# Patient Record
Sex: Female | Born: 1945 | Race: White | Hispanic: No | Marital: Married
Health system: Midwestern US, Academic
[De-identification: ages and names within clinical notes are randomized; demographics above are authoritative.]

---

## 2018-04-10 ENCOUNTER — Ambulatory Visit: Admit: 2018-04-10 | Discharge: 2018-04-10

## 2018-04-10 ENCOUNTER — Encounter: Admit: 2018-04-10 | Discharge: 2018-04-10

## 2018-04-10 ENCOUNTER — Ambulatory Visit: Admit: 2018-04-10 | Discharge: 2018-04-11 | Payer: MEDICARE

## 2018-04-10 DIAGNOSIS — I259 Chronic ischemic heart disease, unspecified: Principal | ICD-10-CM

## 2018-04-10 DIAGNOSIS — I10 Essential (primary) hypertension: ICD-10-CM

## 2018-04-10 DIAGNOSIS — R079 Chest pain, unspecified: ICD-10-CM

## 2022-07-17 IMAGING — CT CTA_BRAIN_&_OR_NECK(Adult)
3 of 4 series · 12 of 33 positions shown, 14 images · non-contrast
Comparison: none

[Series 7: cta brain and/or neck cor 3.00 bv36 s3 · coronal · 0.42mm/px · 3 of 47 slices shown]
[im 15/47  bone]
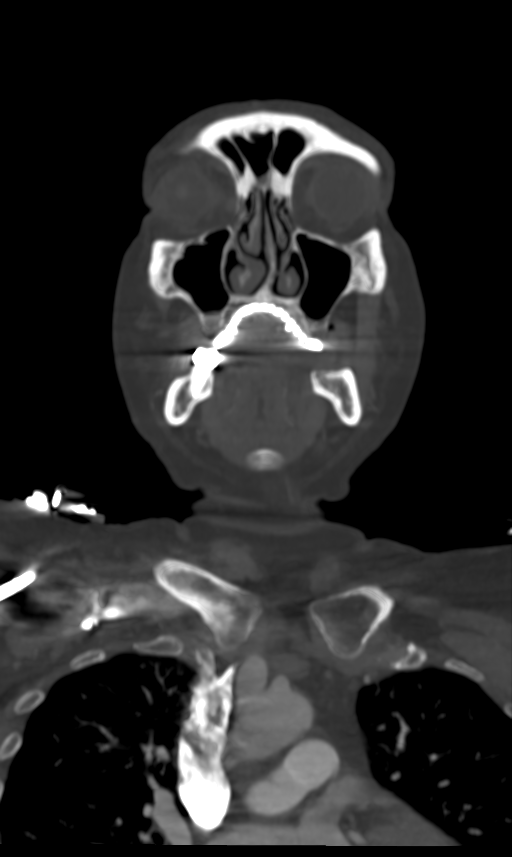
[im 21/47  bone]
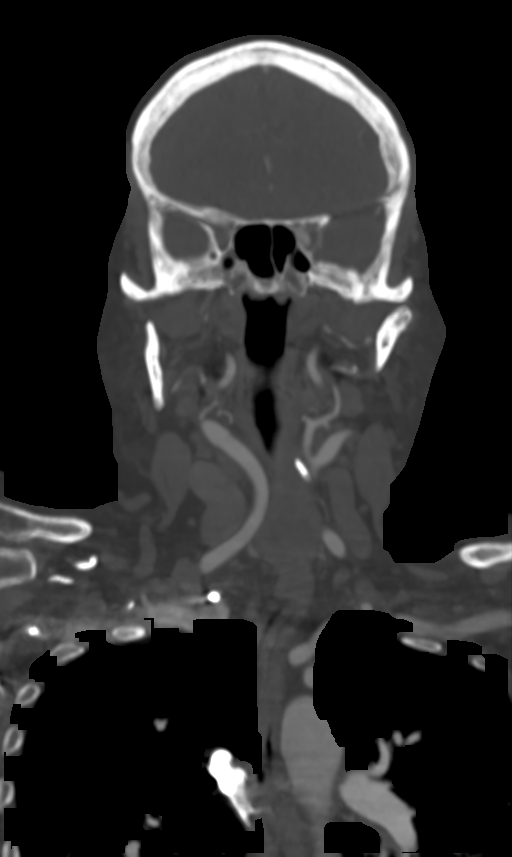
[im 27/47  bone]
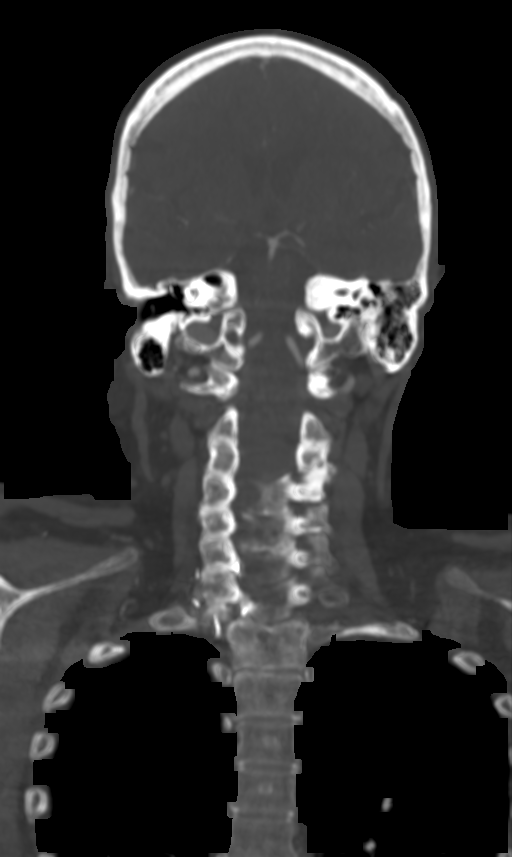

[Series 9: cta brain and/or neck sag 3.00 bv36 s3 · sagittal · 0.47mm/px · 5 of 45 slices shown, 6 images]
[im 15/45  bone]
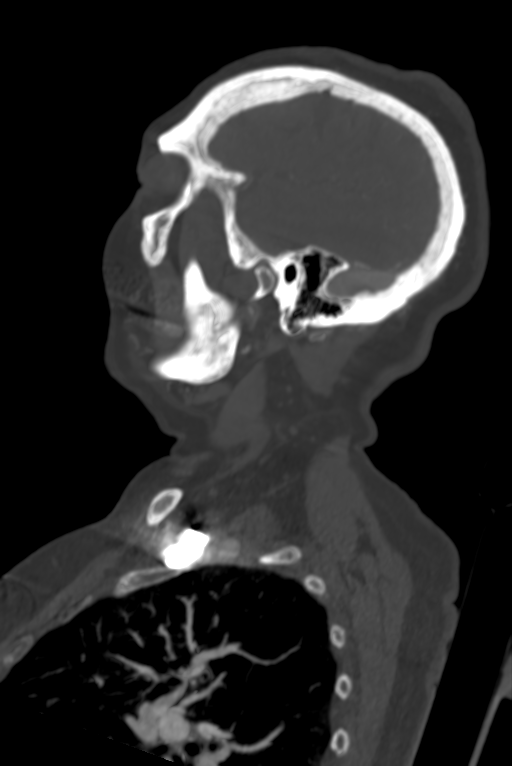
[im 19/45  bone]
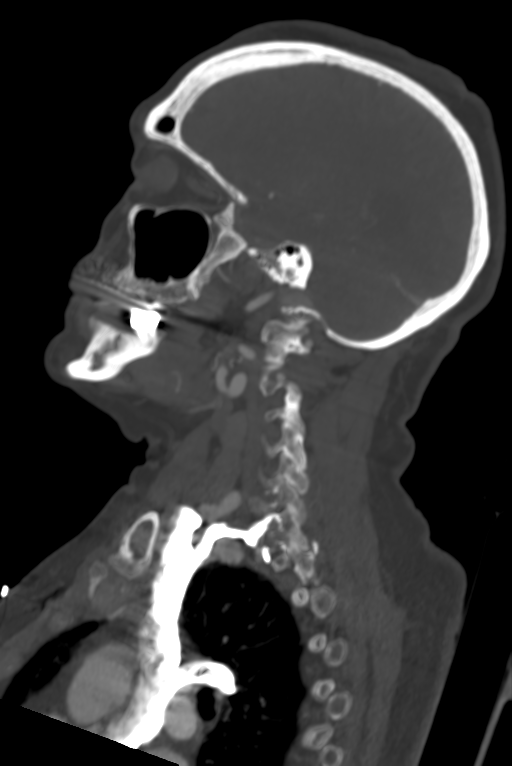
[im 23/45  soft-tissue]
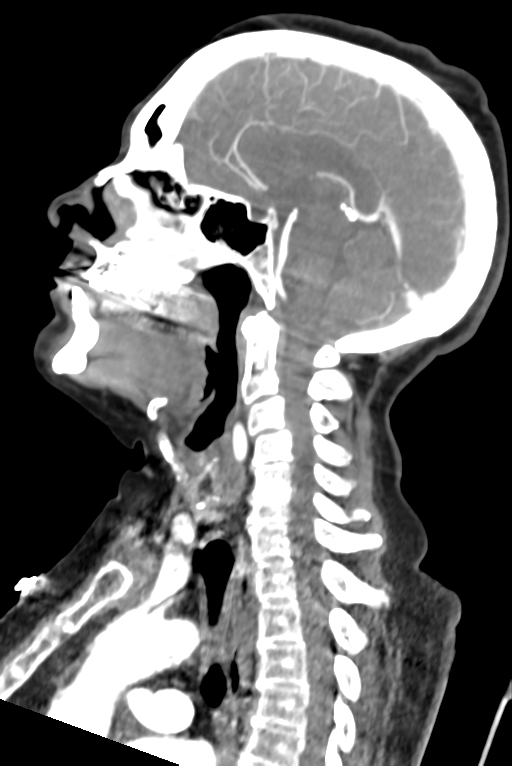
[im 23/45  bone]
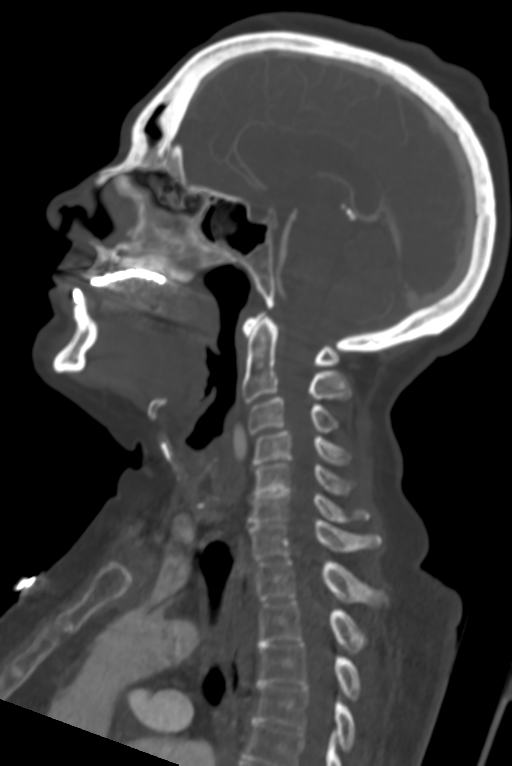
[im 26/45  bone]
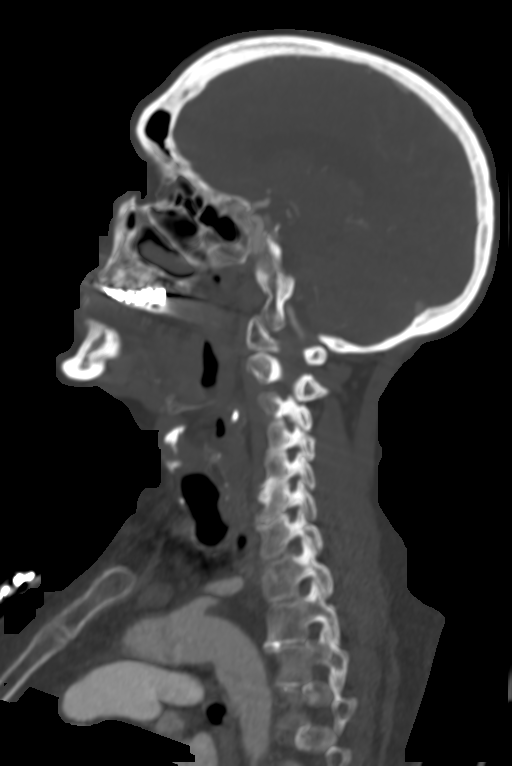
[im 30/45  bone]
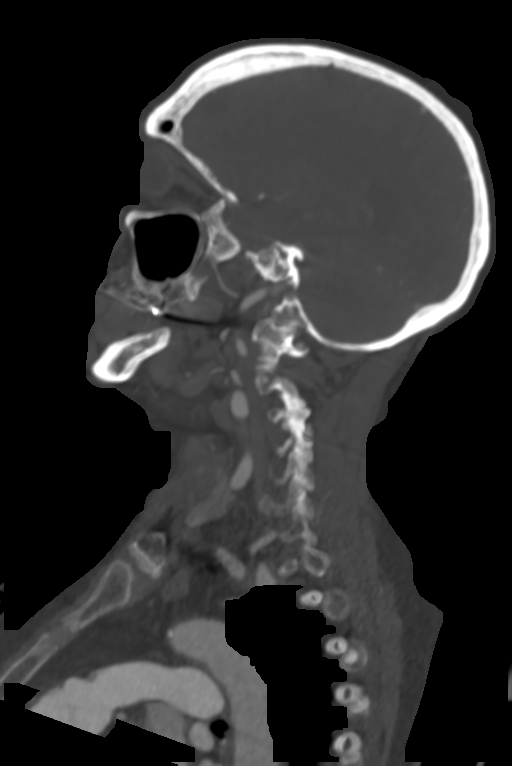

[Series 11: cta brain and/or neck 10.00 bv36 s3 · axial · 0.46mm/px · z∈[-779,-541]mm · 4 of 113 slices shown, 5 images]
[im 17/113  soft-tissue]
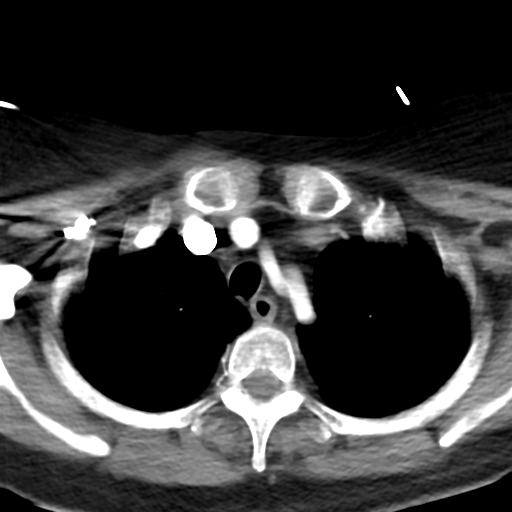
[im 17/113  bone]
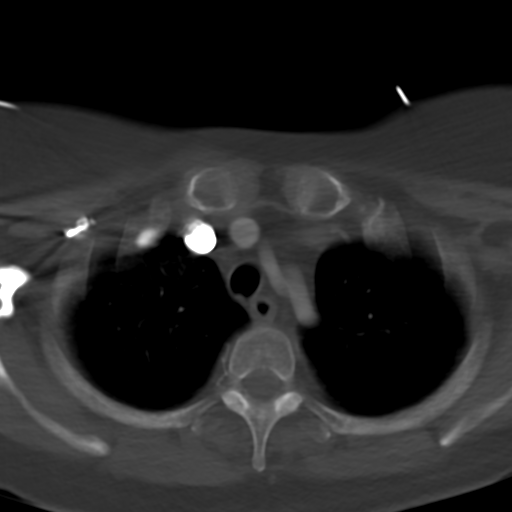
[im 49/113  bone]
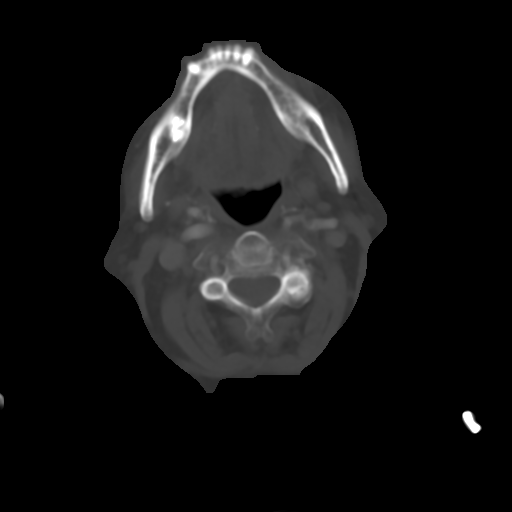
[im 65/113  bone]
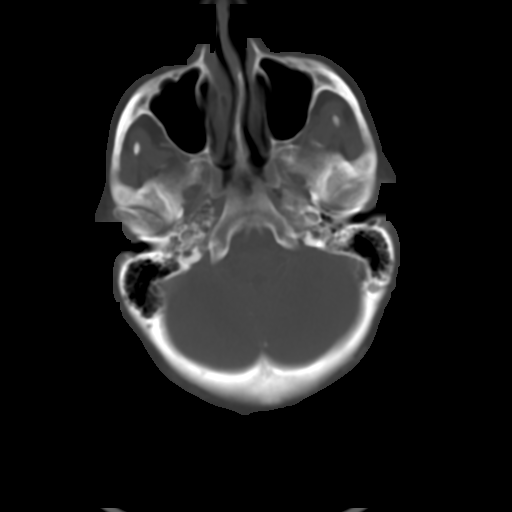
[im 97/113  bone]
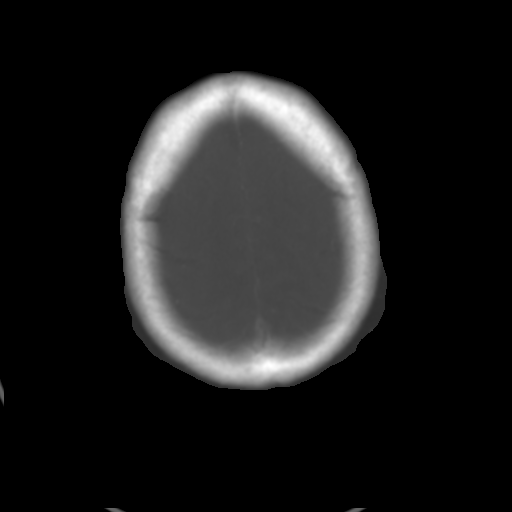

[12 of 33 positions shown; findings below may reference images not displayed]

DIAGNOSTIC STUDIES

EXAM

CT angiogram of the head neck.

INDICATION

dizzi and htn
RECURRING DIZZINESS, WEAKNESS, SOME MEMORY LOSS WITH CONFUSION, HTN. CREAT 0.69, GFR 68.8  100 ML
OMNIPAQUE 350

TECHNIQUE

All CT scans at this facility use dose modulation, iterative reconstruction, and/or weight based
dosing when appropriate to reduce radiation dose to as low as reasonably achievable.

Number of previous computed tomography exams in the last 12 months is 3.

Number of previous nuclear medicine myocardial perfusion studies in the last 12 months is 0  .

COMPARISONS

CT head same date

FINDINGS

Minimal non restrictive calcified plaque is noted the right carotid bulb. No significant stenoses
are noted throughout the visualized great vessels or carotid arteries. The intracranial vascular
structures demonstrate normal flow without aneurysm or vascular malformation. The visualized lung
apices are unremarkable. There are degenerative changes of the spine.

IMPRESSION

Minimal calcified non restrictive plaque the right carotid bulb. No areas of significant stenosis
are seen. Report called to Dr. Uqmdeba at [DATE] p.m..

Tech Notes:

RECURRING DIZZINESS, WEAKNESS, SOME MEMORY LOSS WITH CONFUSION, HTN. CREAT 0.69, GFR 68.8  100 ML
OMNIPAQUE 350

## 2022-07-17 IMAGING — CT CTA_BRAIN_&_OR_NECK(Adult)
3 of 4 series · 12 of 33 positions shown, 14 images · non-contrast
Comparison: none

[Series 604: cta brain and/or neck cor 3.00 bv36 s3 · coronal · 0.42mm/px · 3 of 71 slices shown]
[im 22/71  bone]
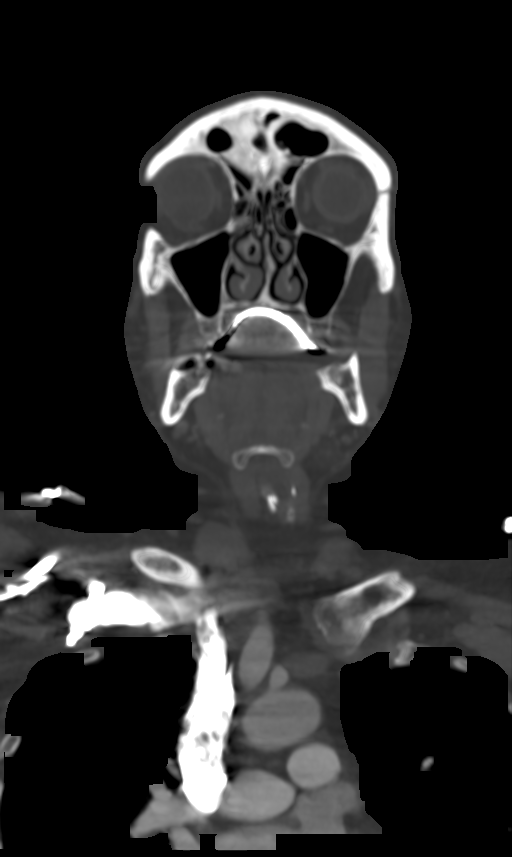
[im 30/71  bone]
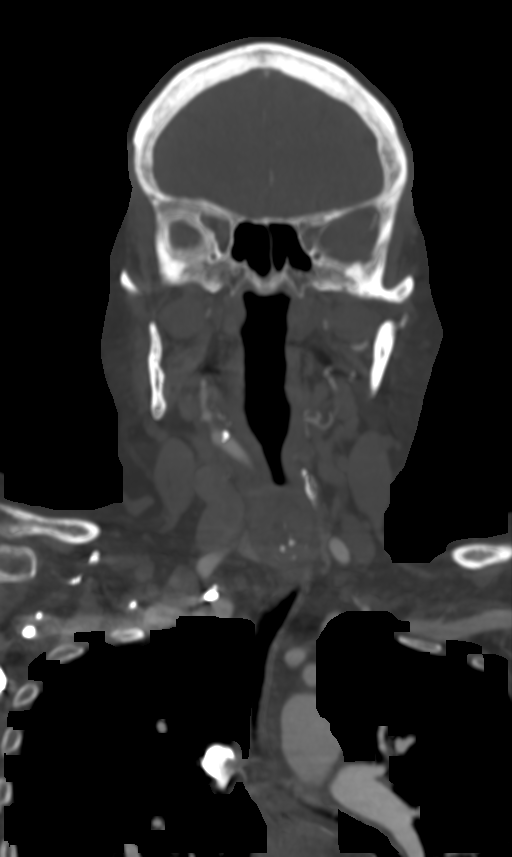
[im 38/71  bone]
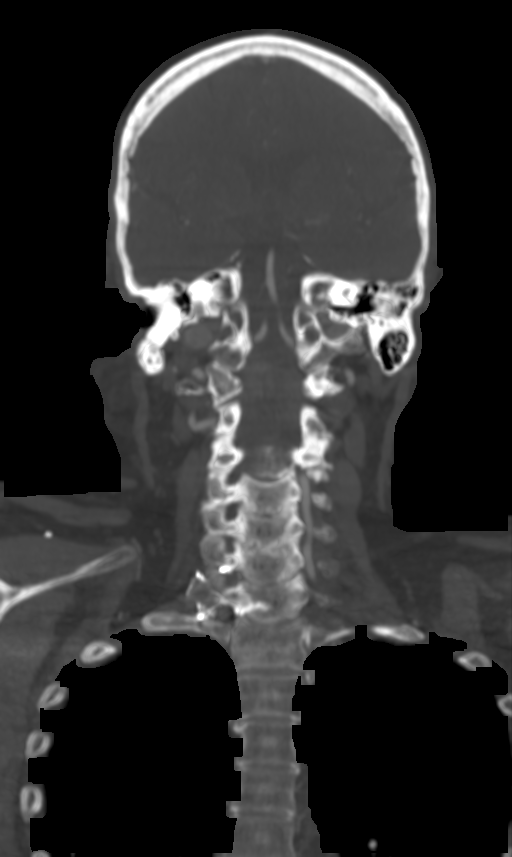

[Series 605: cta brain and/or neck sag 3.00 bv36 s3 · sagittal · 0.47mm/px · 5 of 51 slices shown, 6 images]
[im 17/51  bone]
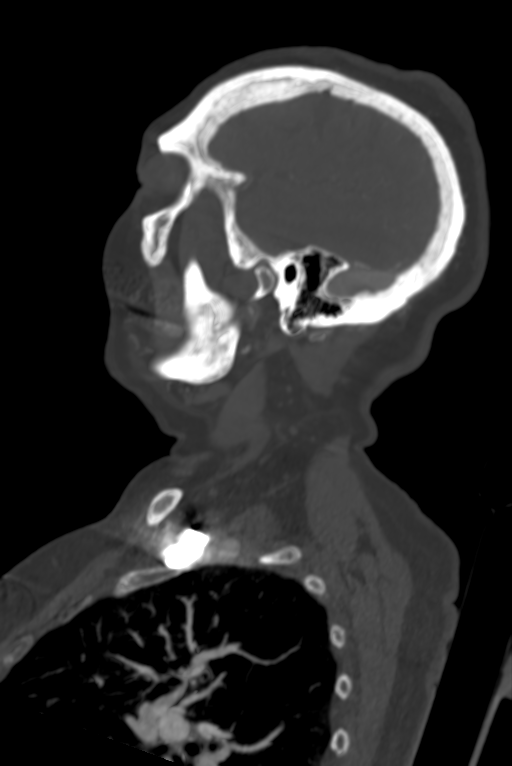
[im 21/51  bone]
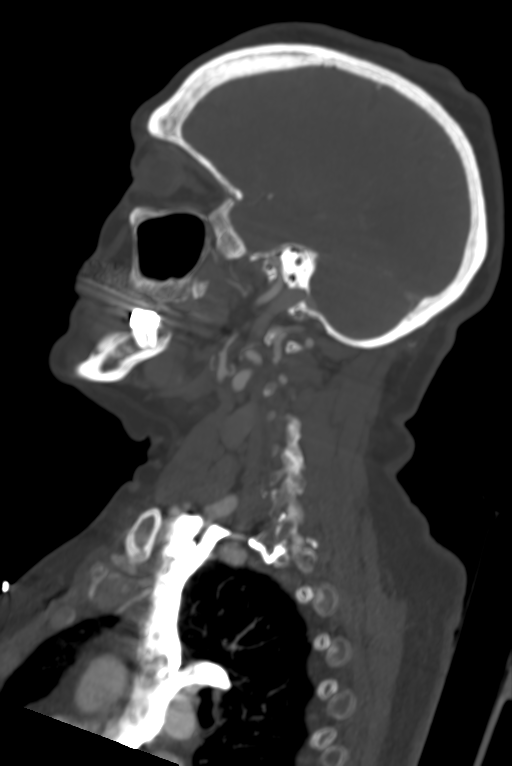
[im 26/51  soft-tissue]
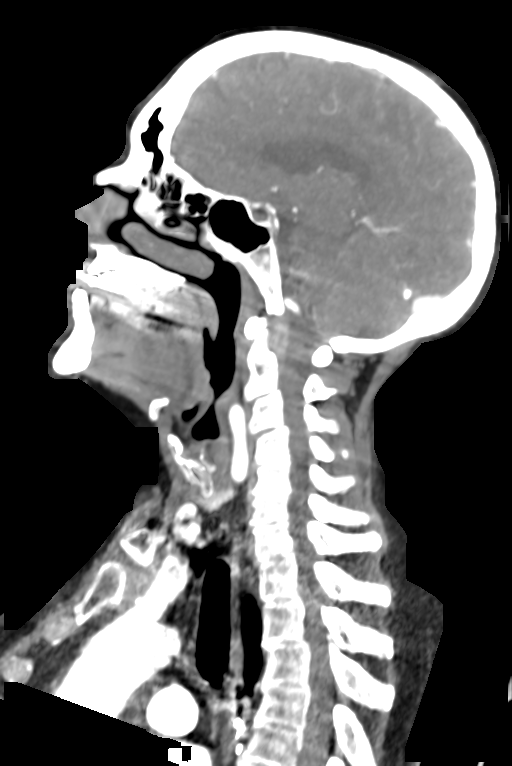
[im 26/51  bone]
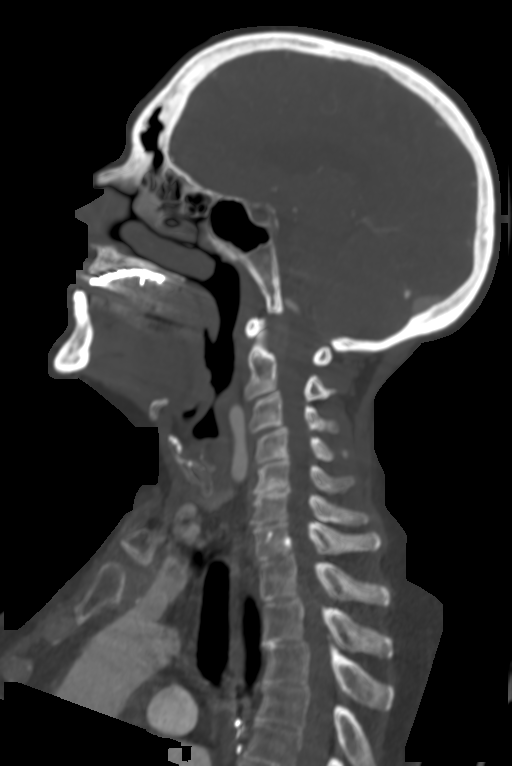
[im 30/51  bone]
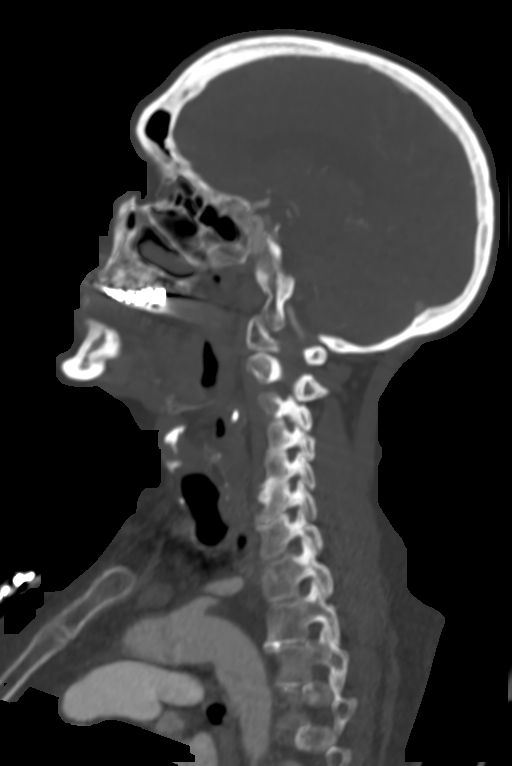
[im 34/51  bone]
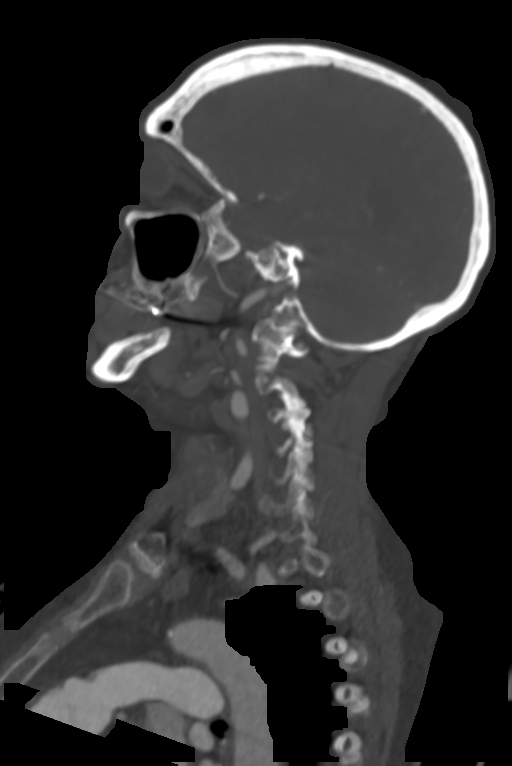

[Series 606: cta brain and/or neck 10.00 bv36 s3 · axial · 0.46mm/px · z∈[-819,-551]mm · 4 of 147 slices shown, 5 images]
[im 19/147  soft-tissue]
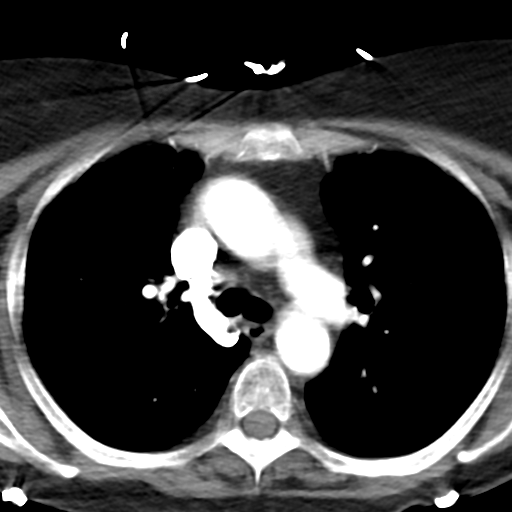
[im 19/147  bone]
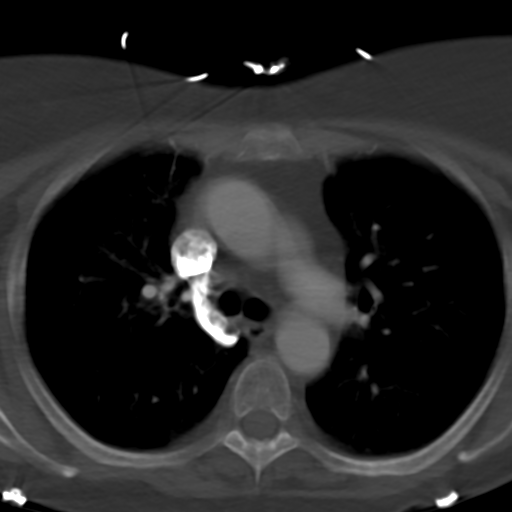
[im 55/147  bone]
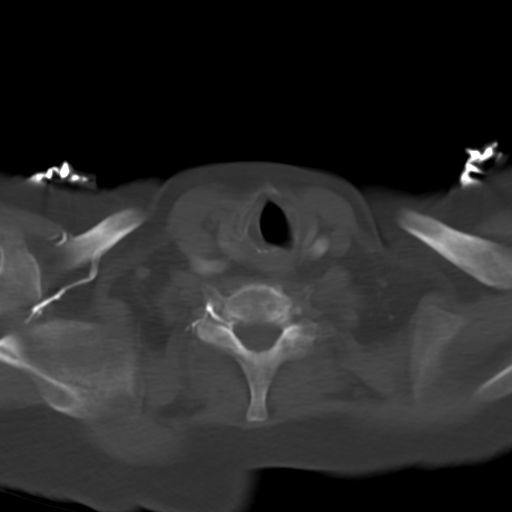
[im 92/147  bone]
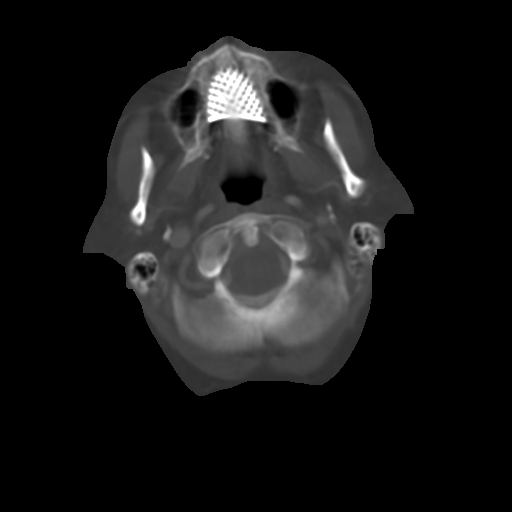
[im 128/147  bone]
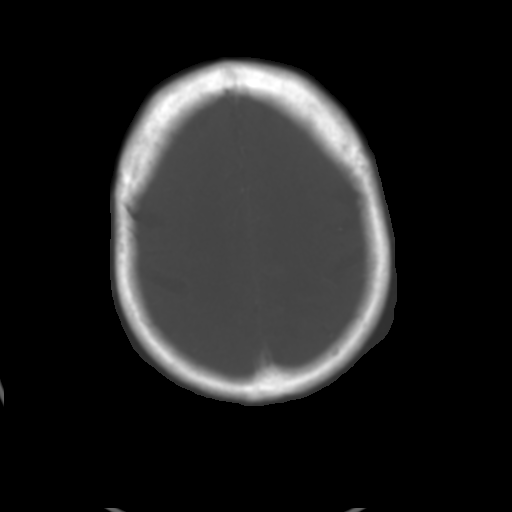

[12 of 33 positions shown; findings below may reference images not displayed]

DIAGNOSTIC STUDIES

EXAM

CT angiogram of the head neck.

INDICATION

dizzi and htn
RECURRING DIZZINESS, WEAKNESS, SOME MEMORY LOSS WITH CONFUSION, HTN. CREAT 0.69, GFR 68.8  100 ML
OMNIPAQUE 350

TECHNIQUE

All CT scans at this facility use dose modulation, iterative reconstruction, and/or weight based
dosing when appropriate to reduce radiation dose to as low as reasonably achievable.

Number of previous computed tomography exams in the last 12 months is 3.

Number of previous nuclear medicine myocardial perfusion studies in the last 12 months is 0  .

COMPARISONS

CT head same date

FINDINGS

Minimal non restrictive calcified plaque is noted the right carotid bulb. No significant stenoses
are noted throughout the visualized great vessels or carotid arteries. The intracranial vascular
structures demonstrate normal flow without aneurysm or vascular malformation. The visualized lung
apices are unremarkable. There are degenerative changes of the spine.

IMPRESSION

Minimal calcified non restrictive plaque the right carotid bulb. No areas of significant stenosis
are seen. Report called to Dr. Uqmdeba at [DATE] p.m..

Tech Notes:

RECURRING DIZZINESS, WEAKNESS, SOME MEMORY LOSS WITH CONFUSION, HTN. CREAT 0.69, GFR 68.8  100 ML
OMNIPAQUE 350

## 2022-07-17 IMAGING — CT BRAIN WO(Adult)
3 of 4 series · 14 of 47 positions shown, 16 images · non-contrast
Comparison: none

[Series 4: brain cor 5.00 hr40 s3 · coronal · 0.31mm/px · 3 of 24 slices shown]
[im 8/24  brain]
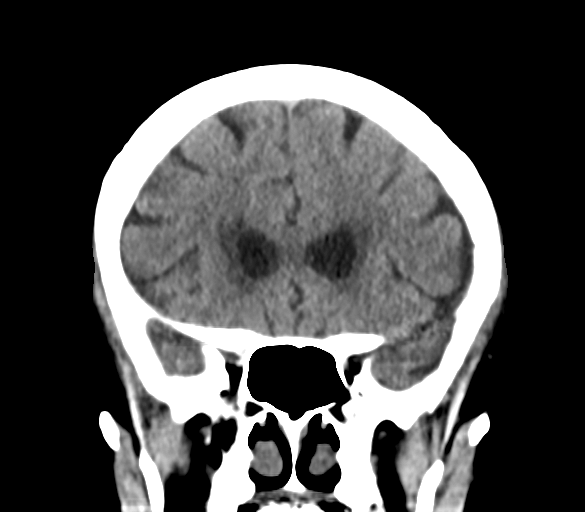
[im 11/24  brain]
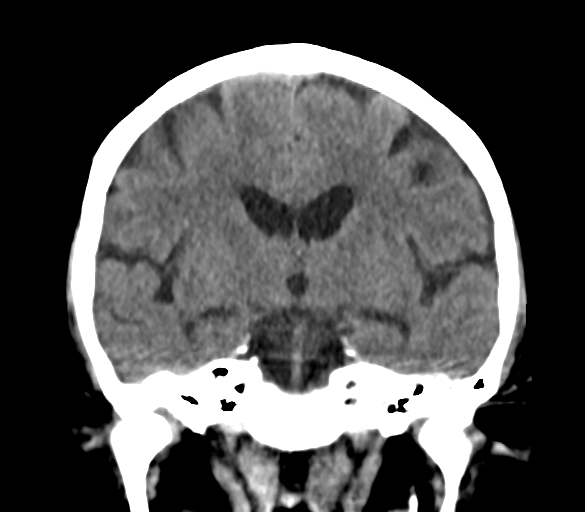
[im 13/24  brain]
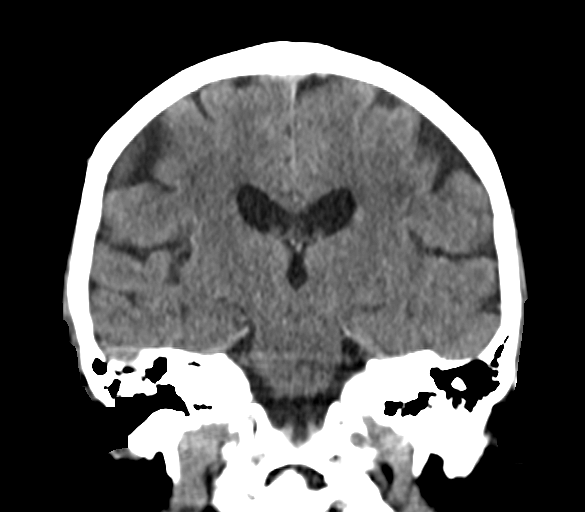

[Series 6: brain sag 5.00 hr40 s3 · sagittal · 0.31mm/px · 3 of 19 slices shown]
[im 7/19  brain]
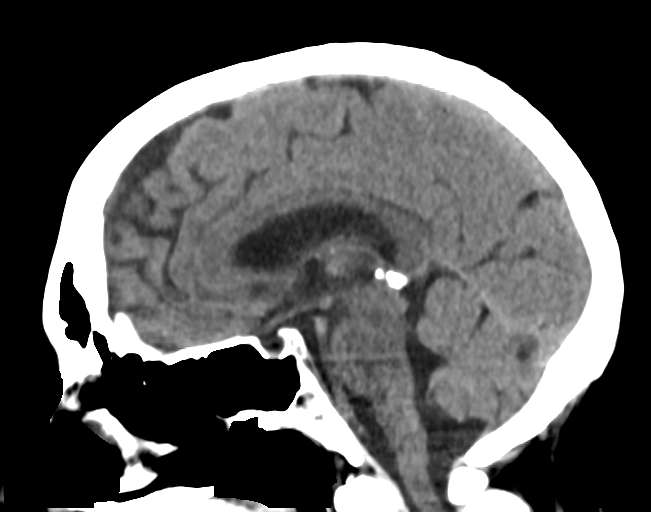
[im 10/19  brain]
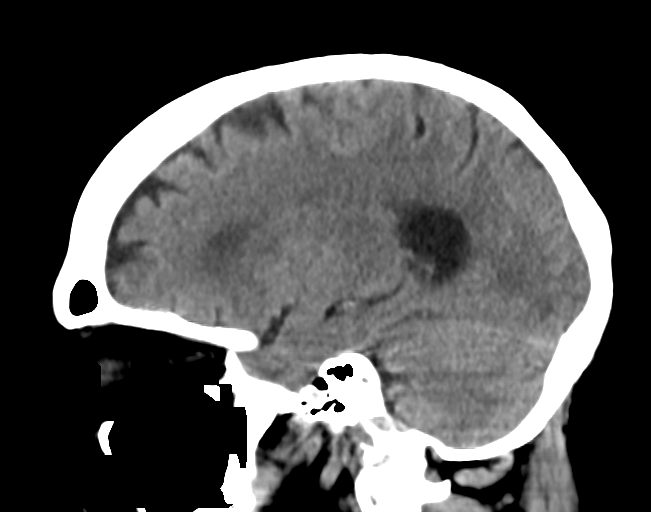
[im 13/19  brain]
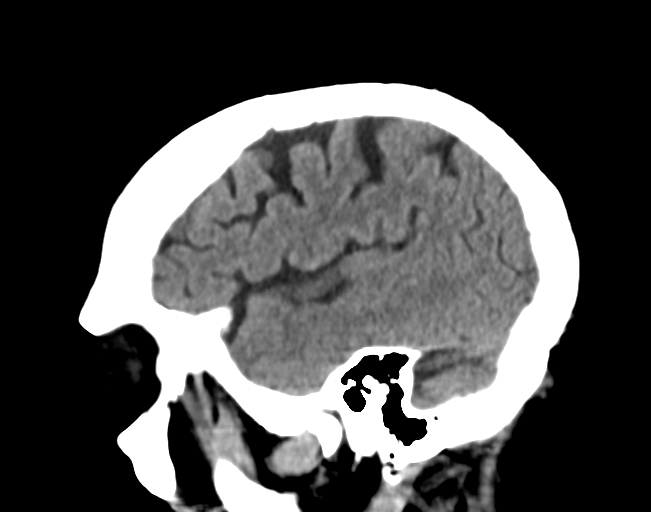

[Series 8: brain ax 2.00 hr60 s3 · axial · 0.36mm/px · z∈[-541,-413]mm · 8 of 49 slices shown, 10 images]
[im 5/49  brain]
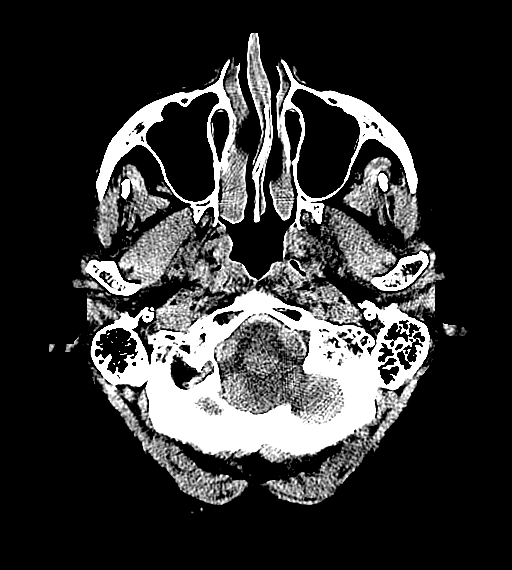
[im 5/49  bone]
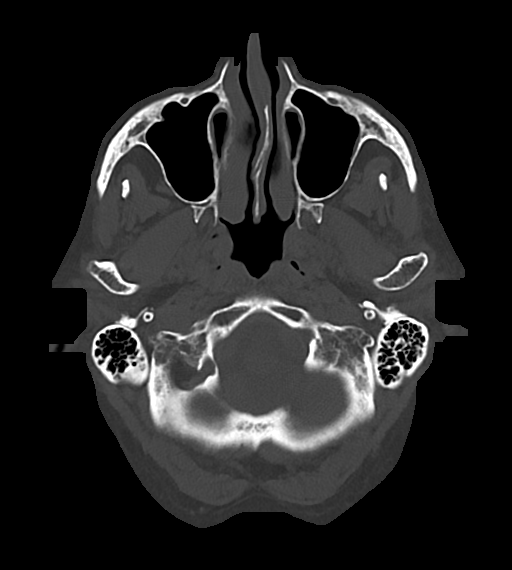
[im 11/49  brain]
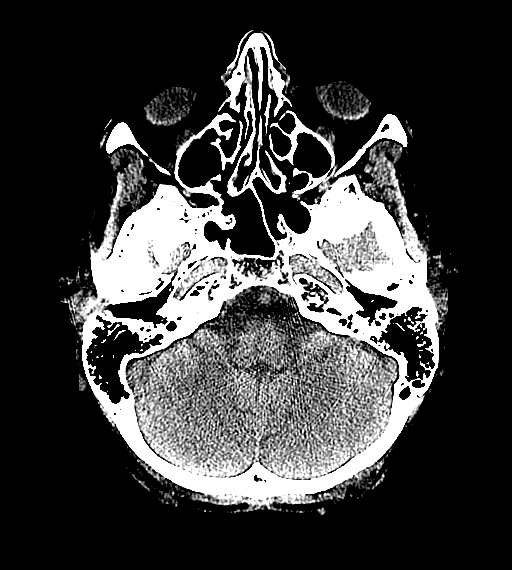
[im 17/49  brain]
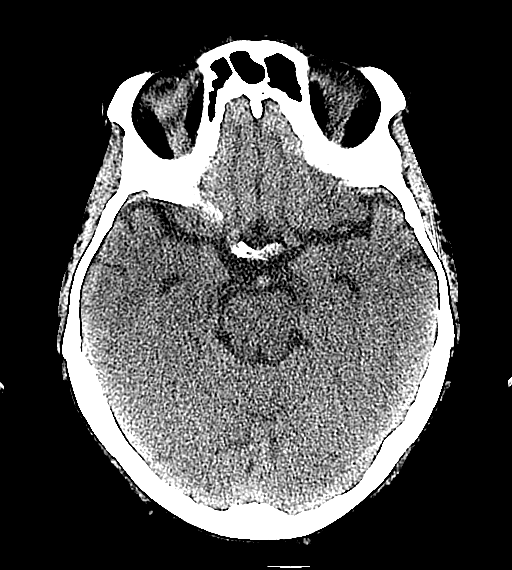
[im 23/49  brain]
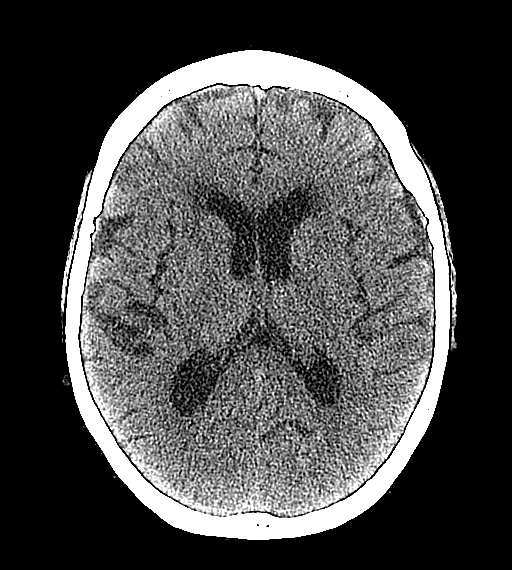
[im 27/49  brain]
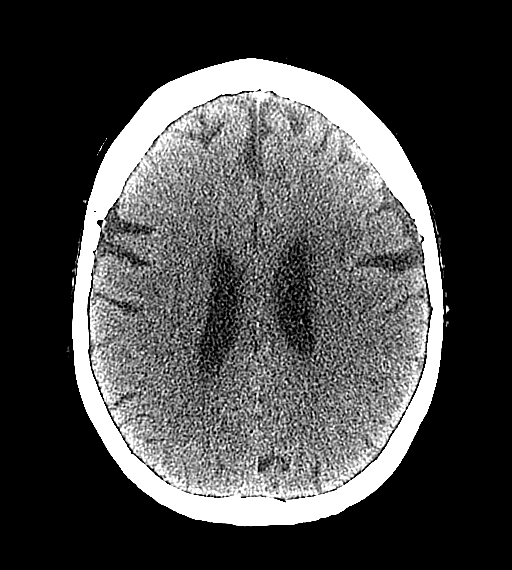
[im 27/49  bone]
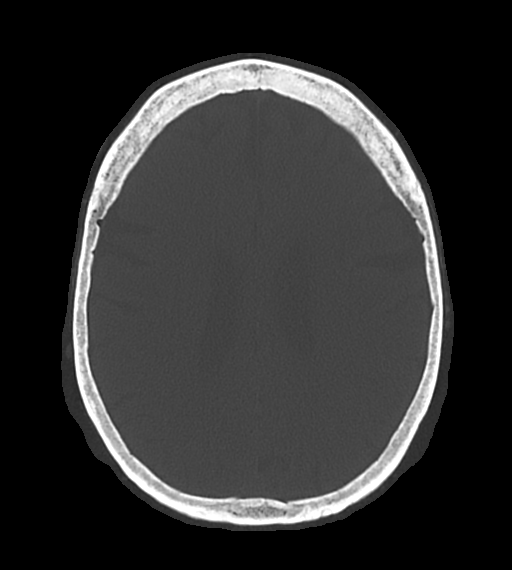
[im 33/49  brain]
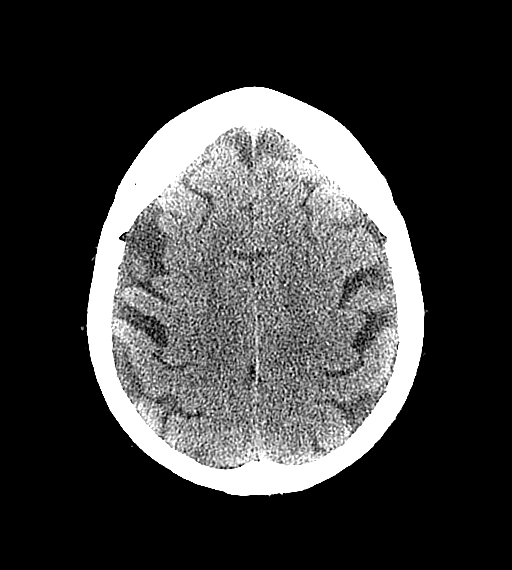
[im 39/49  brain]
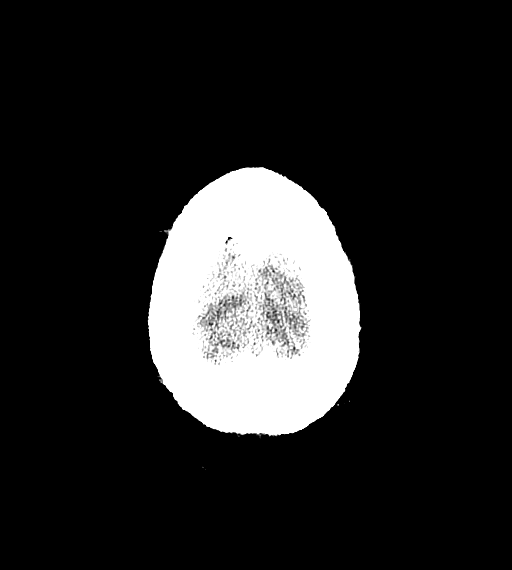
[im 45/49  brain]
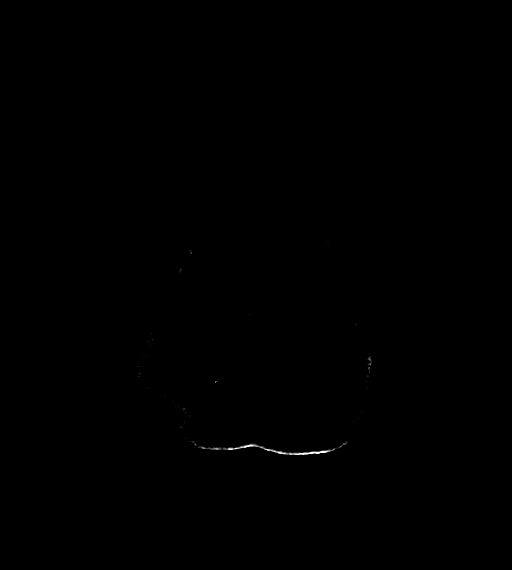

[14 of 47 positions shown; findings below may reference images not displayed]

DIAGNOSTIC STUDIES

EXAM

CT head without contrast

INDICATION

SOA dizzy
PT C/O RIGHT SIDE HEADACHE AND DIZZINESS. ELEVATED B/P. CT/NM 3/0. TJ

TECHNIQUE

All CT scans at this facility use dose modulation, iterative reconstruction, and/or weight based
dosing when appropriate to reduce radiation dose to as low as reasonably achievable.

Number of previous computed tomography exams in the last 12 months is 3.

Number of previous nuclear medicine myocardial perfusion studies in the last 12 months is 0  .

COMPARISONS

October 06, 2020

FINDINGS

There is scattered small vessel ischemic disease and small unchanged lacunar infarct in a right
sub insular location. Probable tiny lacunar infarct can be seen along the anterior limb of the right
internal capsule and head of the caudate nucleus on the right both of which are unchanged.
Dystrophic calcification projects within the medial right cerebellum.  No intracranial hemorrhage or
mass effect is evident. Bony calvarium is unchanged.

IMPRESSION

Small vessel ischemic changes and scattered lacunar infarcts which are stable. No intracranial
hemorrhage or mass effect is seen. Report called to ER [REDACTED] at [DATE] p.m..

Tech Notes:

PT C/O RIGHT SIDE HEADACHE AND DIZZINESS. ELEVATED B/P. CT/NM 3/0. TJ

## 2022-07-21 ENCOUNTER — Encounter: Admit: 2022-07-21 | Discharge: 2022-07-21 | Payer: MEDICARE

## 2022-07-21 NOTE — Telephone Encounter
07-21-2022 Per Task Message, request faxed to [facility], [place], (F) [phone], clp      Please request additional records from PCP,  [facility], [place] - Ph: [phone]; Fax: [phone].

## 2022-07-31 ENCOUNTER — Encounter: Admit: 2022-07-31 | Discharge: 2022-07-31 | Payer: MEDICARE

## 2022-07-31 DIAGNOSIS — I351 Nonrheumatic aortic (valve) insufficiency: Secondary | ICD-10-CM

## 2022-07-31 DIAGNOSIS — K219 Gastro-esophageal reflux disease without esophagitis: Secondary | ICD-10-CM

## 2022-07-31 DIAGNOSIS — E039 Hypothyroidism, unspecified: Secondary | ICD-10-CM

## 2022-08-01 ENCOUNTER — Encounter: Admit: 2022-08-01 | Discharge: 2022-08-01 | Payer: MEDICARE

## 2022-08-02 ENCOUNTER — Encounter: Admit: 2022-08-02 | Discharge: 2022-08-02 | Payer: MEDICARE

## 2022-08-02 DIAGNOSIS — E039 Hypothyroidism, unspecified: Secondary | ICD-10-CM

## 2022-08-02 DIAGNOSIS — K219 Gastro-esophageal reflux disease without esophagitis: Secondary | ICD-10-CM

## 2022-08-02 DIAGNOSIS — I351 Nonrheumatic aortic (valve) insufficiency: Secondary | ICD-10-CM

## 2022-08-04 ENCOUNTER — Encounter: Admit: 2022-08-04 | Discharge: 2022-08-04 | Payer: MEDICARE

## 2022-08-04 DIAGNOSIS — M1991 Primary osteoarthritis, unspecified site: Secondary | ICD-10-CM

## 2022-08-04 DIAGNOSIS — Z8249 Family history of ischemic heart disease and other diseases of the circulatory system: Secondary | ICD-10-CM

## 2022-08-04 DIAGNOSIS — E039 Hypothyroidism, unspecified: Secondary | ICD-10-CM

## 2022-08-04 DIAGNOSIS — I10 Essential (primary) hypertension: Secondary | ICD-10-CM

## 2022-08-04 DIAGNOSIS — I340 Nonrheumatic mitral (valve) insufficiency: Secondary | ICD-10-CM

## 2022-08-04 DIAGNOSIS — Z8489 Family history of other specified conditions: Secondary | ICD-10-CM

## 2022-08-04 DIAGNOSIS — E7800 Pure hypercholesterolemia, unspecified: Secondary | ICD-10-CM

## 2022-08-04 DIAGNOSIS — Z136 Encounter for screening for cardiovascular disorders: Secondary | ICD-10-CM

## 2022-08-04 DIAGNOSIS — Z23 Encounter for immunization: Secondary | ICD-10-CM

## 2022-08-04 DIAGNOSIS — K219 Gastro-esophageal reflux disease without esophagitis: Secondary | ICD-10-CM

## 2022-08-04 DIAGNOSIS — M797 Fibromyalgia: Secondary | ICD-10-CM

## 2022-08-04 DIAGNOSIS — I351 Nonrheumatic aortic (valve) insufficiency: Secondary | ICD-10-CM

## 2022-08-04 MED ORDER — LOSARTAN 25 MG PO TAB
12.5 mg | ORAL_TABLET | Freq: Two times a day (BID) | ORAL | 3 refills | 90.00000 days | Status: AC
Start: 2022-08-04 — End: ?

## 2022-08-04 MED ORDER — CARVEDILOL 25 MG PO TAB
25 mg | ORAL_TABLET | Freq: Two times a day (BID) | ORAL | 3 refills | 90.00000 days | Status: AC
Start: 2022-08-04 — End: ?

## 2022-08-04 NOTE — Patient Instructions
Thank you for visiting our office today.    We would like to make the following medication adjustments:      Stop taking metoprolol and hydrochlorothiazide.  Start taking carvedilol 25 mg twice daily.  Start taking losartan 25 mg 1/2 tablet twice daily.           Otherwise continue the same medications as you have been doing.          We will be pursuing the following tests after your appointment today:    Echocardiogram    We will plan to see you back in 4-5 weeks.  Please call us in the meantime with any questions or concerns.        Please allow 5-7 business days for our providers to review your results. All normal results will go to MyChart. If you do not have Mychart, it is strongly recommended to get this so you can easily view all your results. If you do not have mychart, we will attempt to call you once with normal lab and testing results. If we cannot reach you by phone with normal results, we will send you a letter.  If you have not heard the results of your testing after one week please give [place] a call.       Your Cardiovascular Medicine [place] Team Richardson Landry, Rene Kocher and [place])  phone number is [phone].

## 2022-08-04 NOTE — Progress Notes
Date of Service: 08/04/2022    Colleen Mckay is a 77 y.o. female.       HPI      Colleen Mckay is a 77 y.o. white female with a history of hypertension since 2019, currently on metoprolol and hydrochlorothiazide, asymptomatic aortic valve regurgitation (last assessed on an echocardiogram performed in October 2019.    Patient has been experiencing some symptoms of shortness of breath with physical activity.  On 06/23/2022 she was seen in the emergency room department at [facility], after a blood pressure recorded at home of 186/80 mmHg.  Patient reports that her husband is on hospice, at that time they were having a nurse visiting, she told the nurse that she is not feeling too well, her BP was taken.  Subsequently she was advised to go to the emergency room department.    She was in the ER for approximately 6 hours, she was treated for hypertension, her blood pressure came down to about 140/80 mmHg and she was discharged home in stable condition.  However patient's daughter reports that when her blood pressure was lower, patient did report dizziness, lightheadedness and fatigue.    Patient does not have any documented history of coronary artery disease.    In October 2019 she was evaluated with the following:  2D echo Doppler study dated 04/10/2018-LVEF = 60%, grade 2 diastolic dysfunction, moderate aortic valve regurgitation, mild to moderate mitral valve regurgitation, PAP = 27 mmHg.  Regadenoson MPI dated 04/10/2018-no evidence of ischemia, LVEF 67%.    Family history: Significant for heart disease, patient's mother did undergo stent placement and she also had PAD.    Social history: Patient does not smoke cigarettes and she does not drink alcohol.  She is a retired Runner, broadcasting/film/video.  She also worked as an Arts administrator for [employer].         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   HENT:  Positive for tinnitus.    Eyes: Negative.    Cardiovascular:  Positive for dyspnea on exertion.   Respiratory: Negative.     Endocrine: Negative.    Hematologic/Lymphatic: Negative.    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   Gastrointestinal: Negative.    Genitourinary: Negative.    Neurological:  Positive for headaches, light-headedness and weakness.   Psychiatric/Behavioral:  Positive for depression.  Allergic/Immunologic: Negative.        Physical Exam  General Appearance: normal in appearance  Skin: warm, moist, no ulcers or xanthomas  Eyes: conjunctivae and lids normal, pupils are equal and round  Lips & Oral Mucosa: no pallor or cyanosis  Neck Veins: neck veins are flat, neck veins are not distended  Chest Inspection: chest is normal in appearance  Respiratory Effort: breathing comfortably, no respiratory distress  Auscultation/Percussion: lungs clear to auscultation, no rales or rhonchi, no wheezing  Cardiac Rhythm: regular rhythm and normal rate  Cardiac Auscultation: S1, S2 normal, no rub, no gallop  Murmurs: II/VI systolic ,murmur @ RUSB  Carotid Arteries: normal carotid upstroke bilaterally, no bruit  Lower Extremity Edema: no lower extremity edema  Abdominal Exam: soft, non-tender, no masses, bowel sounds normal  Liver & Spleen: no organomegaly  Language and Memory: patient responsive and seems to comprehend information  Neurologic Exam: neurological assessment grossly intact      Cardiovascular Studies  Twelve-lead EKG demonstrates normal sinus rhythm, no ST segment T wave changes, ventricular rate 51 bpm, no axis deviation.    Cardiovascular Health Factors  Vitals BP Readings from Last 3 Encounters:   08/04/22 (!) 160/88   04/10/18 (!) 207/72     Wt Readings from Last 3 Encounters:   08/04/22 67.1 kg (148 lb)   04/10/18 94.3 kg (208 lb)     BMI Readings from Last 3 Encounters:   08/04/22 28.90 kg/m?   04/10/18 39.30 kg/m?      Smoking Social History     Tobacco Use   Smoking Status Former    Types: Cigarettes   Smokeless Tobacco Never      Lipid Profile Cholesterol   Date Value Ref Range Status   10/20/2020 233 (H) <200 Final     HDL   Date Value Ref Range Status   10/20/2020 68  Final     LDL   Date Value Ref Range Status   10/20/2020 151 (H) <100 Final     Triglycerides   Date Value Ref Range Status   10/20/2020 72  Final      Blood Sugar Hemoglobin A1C   Date Value Ref Range Status   10/20/2020 5.5  Final     Glucose   Date Value Ref Range Status   07/20/2022 81  Final   10/20/2020 92  Final          Problems Addressed Today  Encounter Diagnoses   Name Primary?    Nonrheumatic aortic valve insufficiency Yes    Nonrheumatic mitral valve regurgitation     Primary hypertension     COVID-19 vaccine administered     Fibromyalgia     Pure hypercholesterolemia     Acquired hypothyroidism     Gastroesophageal reflux disease without esophagitis     Primary osteoarthritis, unspecified site     Screening for heart disease     Family history of stent     Family history of peripheral arterial disease        Assessment and Plan     Assessment:    1.  Primary hypertension  Currently on a combination of a beta-blocker and thiazide diuretic and suboptimally controlled  Patient is not compliant with a low-sodium diet  It is also possible that the current bradycardia due to treatment with beta-1 blocker could be partly responsible for a paradoxical response of hypertension  2.  Aortic valve regurgitation-this was detected on an echocardiogram performed in October 2019  3.  Family history of coronary artery disease and PAD-patient's mother  4.  Hyperlipidemia-on statin therapy  5.  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  Moderately elevated BMI = 28.9 kg/m?  7.  Emergency room evaluation on 06/23/2022 due to elevated blood pressure  Patient spent approximately 6 hours in the emergency room department, she was treated for hypertension, it was no evidence of endorgan damage    Plan:    1.  Discontinue metoprolol and hydrochlorothiazide  2.  Start carvedilol 25 mg p.o. twice daily  3.  Start losartan 25-half tablet p.o. twice daily  4.  Follow a strict low-sodium diet  5.  Further evaluation with a 2D echo Doppler study  6.  Follow-up office visit in 4 to 5 weeks    Of note: during today's office visit patient's daughter was performing a video recording during this office visit, she did not ask my permission for this.  I asked her to cease the recording.    Total Time Today was 60 minutes in the following activities: Preparing to see the patient, Obtaining and/or reviewing separately obtained history, Performing a medically appropriate examination and/or evaluation, Counseling and educating the patient/family/caregiver, Ordering medications, tests, or procedures, Referring and communication with other health care professionals (when not separately reported), Documenting clinical information in the electronic or other health record, Independently interpreting results (not separately reported) and communicating results to the patient/family/caregiver, and Care coordination (not separately reported)            Current Medications (including today's revisions)   aspirin/acetaminophen/caffeine (EXCEDRIN MIGRAINE) 250/250/65 mg tablet Take one tablet to two tablets by mouth every 6 hours as needed.    atorvastatin (LIPITOR) 20 mg tablet Take one tablet by mouth daily.    busPIRone (BUSPAR) 5 mg tablet Take one tablet by mouth twice daily.    donepeziL (ARICEPT) 23 mg tablet Take one tablet by mouth at bedtime daily.    duloxetine DR (CYMBALTA) 60 mg capsule Take one capsule by mouth twice daily.    glucos sul 2KCl/msm/chond/C/Mn (GLUCOSAMINE CHONDROITIN PO) Take 1 tablet by mouth daily.    hydroCHLOROthiazide 12.5 mg capsule Take one capsule by mouth daily.    ibuprofen (MOTRIN) 400 mg tablet Take one tablet by mouth every 4-6 hours as needed for Pain. Take with food.    levothyroxine (SYNTHROID) 88 mcg tablet Take one tablet by mouth daily 30 minutes before breakfast.    metoprolol succinate XL (TOPROL XL) 100 mg extended release tablet Take one tablet by mouth daily.    mupirocin (BACTROBAN) 2 % topical ointment Apply one g topically to affected area twice daily.    mv,with Ca-iron-FA-lut-179herb 13.5-200-250 mg-mcg-mcg tab Take 1 tablet by mouth daily.    omeprazole DR (PRILOSEC) 20 mg capsule Take one capsule by mouth daily before breakfast.

## 2022-08-30 ENCOUNTER — Encounter: Admit: 2022-08-30 | Discharge: 2022-08-30 | Payer: MEDICARE

## 2022-08-30 DIAGNOSIS — I351 Nonrheumatic aortic (valve) insufficiency: Secondary | ICD-10-CM

## 2022-09-05 ENCOUNTER — Encounter: Admit: 2022-09-05 | Discharge: 2022-09-05 | Payer: MEDICARE

## 2022-09-05 DIAGNOSIS — I10 Essential (primary) hypertension: Secondary | ICD-10-CM

## 2022-09-05 DIAGNOSIS — Z8249 Family history of ischemic heart disease and other diseases of the circulatory system: Secondary | ICD-10-CM

## 2022-09-05 DIAGNOSIS — K219 Gastro-esophageal reflux disease without esophagitis: Secondary | ICD-10-CM

## 2022-09-05 DIAGNOSIS — M1991 Primary osteoarthritis, unspecified site: Secondary | ICD-10-CM

## 2022-09-05 DIAGNOSIS — Z8489 Family history of other specified conditions: Secondary | ICD-10-CM

## 2022-09-05 DIAGNOSIS — E039 Hypothyroidism, unspecified: Secondary | ICD-10-CM

## 2022-09-05 DIAGNOSIS — I351 Nonrheumatic aortic (valve) insufficiency: Secondary | ICD-10-CM

## 2022-09-05 DIAGNOSIS — Z23 Encounter for immunization: Secondary | ICD-10-CM

## 2022-09-05 DIAGNOSIS — I340 Nonrheumatic mitral (valve) insufficiency: Secondary | ICD-10-CM

## 2022-09-05 DIAGNOSIS — E7800 Pure hypercholesterolemia, unspecified: Secondary | ICD-10-CM

## 2022-09-05 MED ORDER — CARVEDILOL 25 MG PO TAB
25 mg | ORAL_TABLET | Freq: Two times a day (BID) | ORAL | 3 refills | 90.00000 days | Status: AC
Start: 2022-09-05 — End: ?

## 2022-09-05 NOTE — Progress Notes
Date of Service: 09/05/2022    Colleen Mckay is a 77 y.o. female.       HPI     Colleen Mckay is a 77 y.o. 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      Patient was last seen in the office on 08/04/2022, at that time I did suggest adjustments in the antihypertensive drug regimen, I suggested that she discontinues the metoprolol and hydrochlorothiazide and she initiates carvedilol and losartan.    Apparently, some miscommunication/misunderstanding has been with the patient's pharmacy, she has not initiated this regimen.    Patient was evaluated with an echo    On 08/30/2022, it demonstrated: Normal LVEF = 60%, sclerotic aortic valve with mild to moderate regurgitation, mild MR, trace TR, estimated PAP = 24 mmHg.         Vitals:    09/05/22 1321   O2 Device: None (Room air)   PainSc: Zero   Height: 152 cm (4' 11.84)     Body mass index is 29 kg/m?Marland Kitchen     Past Medical History  Patient Active Problem List    Diagnosis Date Noted    Family history of stent 08/04/2022    Family history of peripheral arterial disease 08/04/2022    Nonrheumatic aortic valve insufficiency 07/31/2022    Nonrheumatic mitral valve regurgitation 07/31/2022    Primary hypertension 07/31/2022    COVID-19 vaccine administered 07/31/2022    Fibromyalgia 07/31/2022    Pure hypercholesterolemia 07/31/2022    Acquired hypothyroidism 07/31/2022    Gastroesophageal reflux disease without esophagitis 07/31/2022    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   HENT: Negative.     Eyes: Negative.    Cardiovascular: Negative.    Respiratory: Negative.     Endocrine: Negative.    Hematologic/Lymphatic: Negative.    Skin: Negative.    Musculoskeletal: Negative.    Gastrointestinal: Negative.    Genitourinary: Negative.    Neurological: Negative.    Psychiatric/Behavioral: Negative.     Allergic/Immunologic: Negative.        Physical Exam      General Appearance: normal in appearance  Skin: warm, moist, no ulcers or xanthomas  Eyes: conjunctivae and lids normal, pupils are equal and round  Lips & Oral Mucosa: no pallor or cyanosis  Neck Veins: neck veins are flat, neck veins are not distended  Chest Inspection: chest is normal in appearance  Respiratory Effort: breathing comfortably, no respiratory distress  Auscultation/Percussion: lungs clear to auscultation, no rales or rhonchi, no wheezing  Cardiac Rhythm: regular rhythm and normal rate  Cardiac Auscultation: S1, S2 normal, no rub, no gallop  Murmurs: II/VI systolic ,murmur @ RUSB  Carotid Arteries: normal carotid upstroke bilaterally, no bruit  Lower Extremity Edema: no lower extremity edema  Abdominal Exam: soft, non-tender, no masses, bowel sounds normal  Liver & Spleen: no organomegaly  Language and Memory: patient responsive and seems to comprehend information  Neurologic Exam: neurological assessment grossly intact    Cardiovascular Studies:      Cardiovascular Health Factors  Vitals BP Readings from Last 3 Encounters:   08/30/22 (!) 140/68   08/04/22 (!) 160/88   04/10/18 (!) 207/72     Wt Readings from Last 3 Encounters:  08/30/22 67 kg (147 lb 11.3 oz)   08/04/22 67.1 kg (148 lb)   04/10/18 94.3 kg (208 lb)     BMI Readings from Last 3 Encounters:   09/05/22 29.00 kg/m?   08/30/22 29.00 kg/m?   08/04/22 28.90 kg/m?      Smoking Social History     Tobacco Use   Smoking Status Former    Types: Cigarettes   Smokeless Tobacco Never      Lipid Profile Cholesterol   Date Value Ref Range Status   10/20/2020 233 (H) <200 Final     HDL   Date Value Ref Range Status   10/20/2020 68  Final     LDL   Date Value Ref Range Status   10/20/2020 151 (H) <100 Final     Triglycerides   Date Value Ref Range Status 10/20/2020 72  Final      Blood Sugar Hemoglobin A1C   Date Value Ref Range Status   10/20/2020 5.5  Final     Glucose   Date Value Ref Range Status   07/20/2022 81  Final   10/20/2020 92  Final          Problems Addressed Today  Encounter Diagnoses   Name Primary?    Pure hypercholesterolemia Yes    Primary hypertension     Nonrheumatic mitral valve regurgitation     Nonrheumatic aortic valve insufficiency     Primary osteoarthritis, unspecified site     Gastroesophageal reflux disease without esophagitis     Family history of stent     Family history of peripheral arterial disease     COVID-19 vaccine administered     Acquired hypothyroidism        Assessment and Plan     Assessment:    1.  Primary hypertension  Patient continues on metoprolol and hydrochlorothiazide  Carvedilol and losartan were suggested at the last office visit on 08/04/2022  However due to miscommunication with the patient's pharmacy disease new regimen has not yet been initiated  2.  Nonrheumatic mild to moderate aortic valve regurgitation  Last assessed on the 2D echo Doppler study dated 08/30/2022  3.  Abnormal finding on the physical exam, heart murmur due to #2  4.  Hyperlipidemia-on atorvastatin  5.  Relative bradycardia  Patient is on a beta-blocker  ?  Could be the cause of patient's hypertension  6.  Also, dementia-on Aricept  7.  Moderate elevated BMI = 28.9 kg/m?  8.  History of hypertensive urgency  Patient was evaluated in the emergency room department on 06/23/2022, and at times her blood pressure was 196/80 mmHg    Plan:    1.  I strongly encouraged the patient to check with her pharmacy and clarify the prescriptions for the antihypertensive drug therapy  2.  Follow-up office visit in 4 to 6 weeks in our [place] office to check the BP and discuss further management.      Total Time Today was 30 minutes in the following activities: Preparing to see the patient, Obtaining and/or reviewing separately obtained history, Performing a medically appropriate examination and/or evaluation, Counseling and educating the patient/family/caregiver, Ordering medications, tests, or procedures, Referring and communication with other health care professionals (when not separately reported), Documenting clinical information in the electronic or other health record, Independently interpreting results (not separately reported) and communicating results to the patient/family/caregiver, and Care coordination (not separately reported)              Current  Medications (including today's revisions)   aspirin/acetaminophen/caffeine (EXCEDRIN MIGRAINE) 250/250/65 mg tablet Take one tablet to two tablets by mouth every 6 hours as needed.    atorvastatin (LIPITOR) 20 mg tablet Take one tablet by mouth daily.    busPIRone (BUSPAR) 5 mg tablet Take one tablet by mouth twice daily.    carvediloL (COREG) 25 mg tablet Take one tablet by mouth twice daily with meals. Take with food.    donepeziL (ARICEPT) 23 mg tablet Take one tablet by mouth at bedtime daily.    duloxetine DR (CYMBALTA) 60 mg capsule Take one capsule by mouth twice daily.    glucos sul 2KCl/msm/chond/C/Mn (GLUCOSAMINE CHONDROITIN PO) Take 1 tablet by mouth daily.    ibuprofen (MOTRIN) 400 mg tablet Take one tablet by mouth every 4-6 hours as needed for Pain. Take with food.    levothyroxine (SYNTHROID) 88 mcg tablet Take one tablet by mouth daily 30 minutes before breakfast.    losartan (COZAAR) 25 mg tablet Take one-half tablet by mouth twice daily.    mupirocin (BACTROBAN) 2 % topical ointment Apply one g topically to affected area twice daily.    mv,with Ca-iron-FA-lut-179herb 13.5-200-250 mg-mcg-mcg tab Take 1 tablet by mouth daily.    omeprazole DR (PRILOSEC) 20 mg capsule Take one capsule by mouth daily before breakfast.

## 2022-09-05 NOTE — Patient Instructions
Take medications as directed  Follow up in 6 weeks.  Follow up as directed.  Call sooner if issues.  Call the [place] line at [phone].  Leave a detailed message for the nurse in [place] with how we can assist you and we will call you back.

## 2022-10-20 ENCOUNTER — Encounter: Admit: 2022-10-20 | Discharge: 2022-10-20 | Payer: MEDICARE

## 2022-10-20 DIAGNOSIS — M1991 Primary osteoarthritis, unspecified site: Secondary | ICD-10-CM

## 2022-10-20 DIAGNOSIS — Z23 Encounter for immunization: Secondary | ICD-10-CM

## 2022-10-20 DIAGNOSIS — Z8249 Family history of ischemic heart disease and other diseases of the circulatory system: Secondary | ICD-10-CM

## 2022-10-20 DIAGNOSIS — E039 Hypothyroidism, unspecified: Secondary | ICD-10-CM

## 2022-10-20 DIAGNOSIS — I10 Essential (primary) hypertension: Secondary | ICD-10-CM

## 2022-10-20 DIAGNOSIS — E7800 Pure hypercholesterolemia, unspecified: Secondary | ICD-10-CM

## 2022-10-20 DIAGNOSIS — I351 Nonrheumatic aortic (valve) insufficiency: Secondary | ICD-10-CM

## 2022-10-20 DIAGNOSIS — K219 Gastro-esophageal reflux disease without esophagitis: Secondary | ICD-10-CM

## 2022-10-20 DIAGNOSIS — I340 Nonrheumatic mitral (valve) insufficiency: Secondary | ICD-10-CM

## 2022-10-20 DIAGNOSIS — Z8489 Family history of other specified conditions: Secondary | ICD-10-CM

## 2022-10-20 MED ORDER — LOSARTAN 25 MG PO TAB
12.5 mg | ORAL_TABLET | Freq: Two times a day (BID) | ORAL | 3 refills | 90.00000 days | Status: AC
Start: 2022-10-20 — End: ?

## 2022-10-20 MED ORDER — CARVEDILOL 25 MG PO TAB
25 mg | ORAL_TABLET | Freq: Two times a day (BID) | ORAL | 3 refills | 90.00000 days | Status: AC
Start: 2022-10-20 — End: ?

## 2022-11-16 ENCOUNTER — Encounter: Admit: 2022-11-16 | Discharge: 2022-11-16 | Payer: MEDICARE

## 2022-11-16 NOTE — Telephone Encounter
-----   Message from Selinda Flavin, MD sent at 10/20/2022  4:54 PM CDT -----  Dear [name],    Please schedule a 2D echo Doppler study for this patient to be performed next year in April 2025, will follow-up on the results.  She can follow-up for further blood pressure uptitration medications with her PCP.    Thank you

## 2023-07-09 ENCOUNTER — Encounter: Admit: 2023-07-09 | Discharge: 2023-07-09 | Payer: MEDICARE

## 2023-07-10 ENCOUNTER — Encounter: Admit: 2023-07-10 | Discharge: 2023-07-10 | Payer: MEDICARE

## 2023-09-18 ENCOUNTER — Encounter: Admit: 2023-09-18 | Discharge: 2023-09-18 | Payer: MEDICARE

## 2023-09-18 DIAGNOSIS — Z8249 Family history of ischemic heart disease and other diseases of the circulatory system: Secondary | ICD-10-CM

## 2023-09-18 DIAGNOSIS — I10 Essential (primary) hypertension: Secondary | ICD-10-CM

## 2023-09-18 DIAGNOSIS — M797 Fibromyalgia: Secondary | ICD-10-CM

## 2023-09-18 DIAGNOSIS — Z8489 Family history of other specified conditions: Secondary | ICD-10-CM

## 2023-09-18 NOTE — Telephone Encounter
 Faxed order to [employer] scheduling

## 2023-09-18 NOTE — Telephone Encounter
-----   Message from Bertram Millard sent at 11/16/2022  9:22 AM CDT -----    ----- Message -----  From: Dorris Fetch, MD  Sent: 10/20/2022   4:55 PM CDT  To: Weston Brass    Dear [name],    Please schedule a 2D echo Doppler study for this patient to be performed next year in April 2025, will follow-up on the results.  She can follow-up for further blood pressure uptitration medications with her PCP.    Thank you

## 2023-09-26 ENCOUNTER — Encounter: Admit: 2023-09-26 | Discharge: 2023-09-26

## 2023-10-25 ENCOUNTER — Encounter: Admit: 2023-10-25 | Discharge: 2023-10-25 | Payer: MEDICARE

## 2023-10-25 MED ORDER — LOSARTAN 25 MG PO TAB
12.5 mg | ORAL_TABLET | Freq: Two times a day (BID) | ORAL | 3 refills | 90.00000 days | Status: AC
Start: 2023-10-25 — End: ?

## 2023-11-21 ENCOUNTER — Encounter: Admit: 2023-11-21 | Discharge: 2023-11-21 | Payer: MEDICARE

## 2023-11-21 ENCOUNTER — Ambulatory Visit: Admit: 2023-11-21 | Discharge: 2023-11-21 | Payer: MEDICARE
